# Patient Record
Sex: Female | Born: 1950 | Race: White | Hispanic: No | Marital: Married
Health system: Southern US, Community
[De-identification: ages and names within clinical notes are randomized; demographics above are authoritative.]

---

## 1998-11-17 ENCOUNTER — Other Ambulatory Visit: Admission: RE | Admit: 1998-11-17 | Discharge: 1998-11-17 | Payer: Self-pay | Admitting: *Deleted

## 1999-07-07 ENCOUNTER — Encounter: Payer: Self-pay | Admitting: Obstetrics and Gynecology

## 1999-07-07 ENCOUNTER — Ambulatory Visit (HOSPITAL_COMMUNITY): Admission: RE | Admit: 1999-07-07 | Discharge: 1999-07-07 | Payer: Self-pay | Admitting: Obstetrics and Gynecology

## 2000-01-02 ENCOUNTER — Other Ambulatory Visit: Admission: RE | Admit: 2000-01-02 | Discharge: 2000-01-02 | Payer: Self-pay | Admitting: *Deleted

## 2000-02-07 ENCOUNTER — Encounter: Admission: RE | Admit: 2000-02-07 | Discharge: 2000-02-07 | Payer: Self-pay | Admitting: Obstetrics and Gynecology

## 2000-02-07 ENCOUNTER — Encounter: Payer: Self-pay | Admitting: Obstetrics and Gynecology

## 2001-02-12 ENCOUNTER — Ambulatory Visit (HOSPITAL_COMMUNITY): Admission: RE | Admit: 2001-02-12 | Discharge: 2001-02-12 | Payer: Self-pay | Admitting: Obstetrics and Gynecology

## 2001-02-12 ENCOUNTER — Encounter: Payer: Self-pay | Admitting: Obstetrics and Gynecology

## 2001-03-13 ENCOUNTER — Other Ambulatory Visit: Admission: RE | Admit: 2001-03-13 | Discharge: 2001-03-13 | Payer: Self-pay | Admitting: Obstetrics and Gynecology

## 2002-02-18 ENCOUNTER — Ambulatory Visit (HOSPITAL_COMMUNITY): Admission: RE | Admit: 2002-02-18 | Discharge: 2002-02-18 | Payer: Self-pay | Admitting: Obstetrics and Gynecology

## 2002-02-18 ENCOUNTER — Encounter: Payer: Self-pay | Admitting: Obstetrics and Gynecology

## 2002-07-07 ENCOUNTER — Other Ambulatory Visit: Admission: RE | Admit: 2002-07-07 | Discharge: 2002-07-07 | Payer: Self-pay | Admitting: Obstetrics and Gynecology

## 2003-03-05 ENCOUNTER — Ambulatory Visit (HOSPITAL_COMMUNITY): Admission: RE | Admit: 2003-03-05 | Discharge: 2003-03-05 | Payer: Self-pay | Admitting: Obstetrics and Gynecology

## 2003-03-05 ENCOUNTER — Encounter: Payer: Self-pay | Admitting: Obstetrics and Gynecology

## 2004-03-11 ENCOUNTER — Ambulatory Visit (HOSPITAL_COMMUNITY): Admission: RE | Admit: 2004-03-11 | Discharge: 2004-03-11 | Payer: Self-pay | Admitting: Obstetrics and Gynecology

## 2005-03-27 ENCOUNTER — Ambulatory Visit (HOSPITAL_COMMUNITY): Admission: RE | Admit: 2005-03-27 | Discharge: 2005-03-27 | Payer: Self-pay | Admitting: *Deleted

## 2006-05-09 ENCOUNTER — Ambulatory Visit (HOSPITAL_COMMUNITY): Admission: RE | Admit: 2006-05-09 | Discharge: 2006-05-09 | Payer: Self-pay | Admitting: Obstetrics and Gynecology

## 2007-05-27 ENCOUNTER — Ambulatory Visit (HOSPITAL_COMMUNITY): Admission: RE | Admit: 2007-05-27 | Discharge: 2007-05-27 | Payer: Self-pay | Admitting: Obstetrics and Gynecology

## 2008-06-09 ENCOUNTER — Ambulatory Visit (HOSPITAL_COMMUNITY): Admission: RE | Admit: 2008-06-09 | Discharge: 2008-06-09 | Payer: Self-pay | Admitting: Obstetrics and Gynecology

## 2009-06-10 ENCOUNTER — Ambulatory Visit (HOSPITAL_COMMUNITY): Admission: RE | Admit: 2009-06-10 | Discharge: 2009-06-10 | Payer: Self-pay | Admitting: Obstetrics and Gynecology

## 2010-06-13 ENCOUNTER — Ambulatory Visit (HOSPITAL_COMMUNITY)
Admission: RE | Admit: 2010-06-13 | Discharge: 2010-06-13 | Payer: Self-pay | Source: Home / Self Care | Attending: Obstetrics and Gynecology | Admitting: Obstetrics and Gynecology

## 2011-05-12 ENCOUNTER — Other Ambulatory Visit (HOSPITAL_COMMUNITY): Payer: Self-pay | Admitting: Obstetrics

## 2011-05-12 ENCOUNTER — Other Ambulatory Visit (HOSPITAL_COMMUNITY): Payer: Self-pay | Admitting: Obstetrics and Gynecology

## 2011-05-12 DIAGNOSIS — Z1231 Encounter for screening mammogram for malignant neoplasm of breast: Secondary | ICD-10-CM

## 2011-06-15 ENCOUNTER — Ambulatory Visit (HOSPITAL_COMMUNITY)
Admission: RE | Admit: 2011-06-15 | Discharge: 2011-06-15 | Disposition: A | Discharge status: Home / Self Care | Payer: BC Managed Care – PPO | Source: Ambulatory Visit | Attending: Obstetrics and Gynecology | Admitting: Obstetrics and Gynecology

## 2011-06-15 DIAGNOSIS — Z1231 Encounter for screening mammogram for malignant neoplasm of breast: Secondary | ICD-10-CM

## 2012-06-14 ENCOUNTER — Other Ambulatory Visit (HOSPITAL_COMMUNITY): Payer: Self-pay | Admitting: Obstetrics and Gynecology

## 2012-06-14 DIAGNOSIS — Z1231 Encounter for screening mammogram for malignant neoplasm of breast: Secondary | ICD-10-CM

## 2012-07-01 ENCOUNTER — Ambulatory Visit (HOSPITAL_COMMUNITY)
Admission: RE | Admit: 2012-07-01 | Discharge: 2012-07-01 | Disposition: A | Discharge status: Home / Self Care | Payer: BC Managed Care – PPO | Source: Ambulatory Visit | Attending: Obstetrics and Gynecology | Admitting: Obstetrics and Gynecology

## 2012-07-01 DIAGNOSIS — Z1231 Encounter for screening mammogram for malignant neoplasm of breast: Secondary | ICD-10-CM

## 2013-05-28 ENCOUNTER — Other Ambulatory Visit (HOSPITAL_COMMUNITY): Payer: Self-pay | Admitting: Obstetrics and Gynecology

## 2013-05-28 DIAGNOSIS — Z1231 Encounter for screening mammogram for malignant neoplasm of breast: Secondary | ICD-10-CM

## 2013-07-02 ENCOUNTER — Ambulatory Visit (HOSPITAL_COMMUNITY)
Admission: RE | Admit: 2013-07-02 | Discharge: 2013-07-02 | Disposition: A | Discharge status: Home / Self Care | Payer: BC Managed Care – PPO | Source: Ambulatory Visit | Attending: Obstetrics and Gynecology | Admitting: Obstetrics and Gynecology

## 2013-07-02 DIAGNOSIS — Z1231 Encounter for screening mammogram for malignant neoplasm of breast: Secondary | ICD-10-CM | POA: Insufficient documentation

## 2013-07-02 IMAGING — MG MM 3D TOMO
8 series · 9 of 24 positions shown · non-contrast
Comparison: Previous exam(s).

CLINICAL DATA: Screening.

EXAM:
DIGITAL SCREENING BILATERAL MAMMOGRAM WITH 3D TOMO WITH CAD

[R MLO]
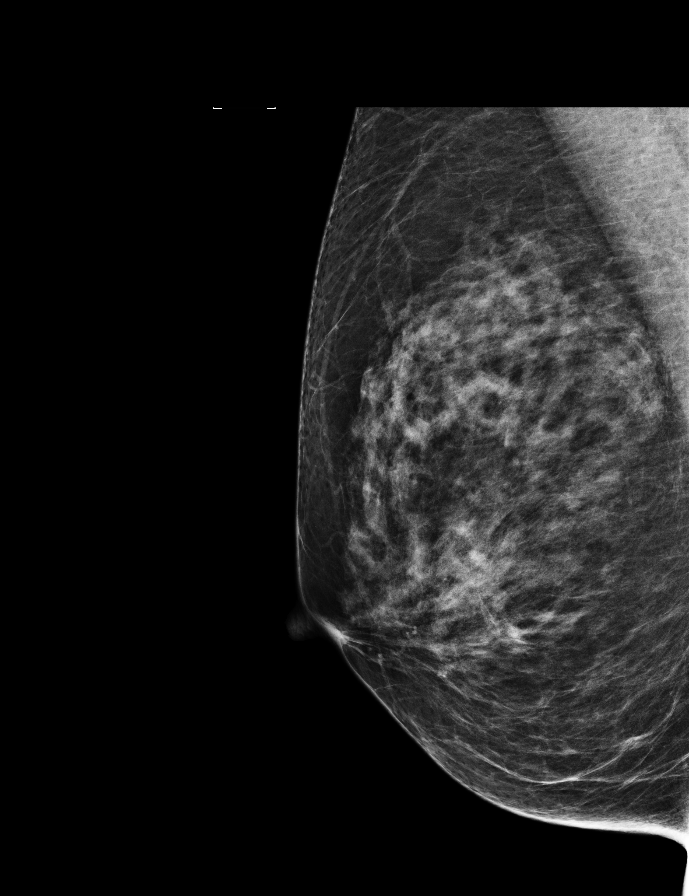

[L CC]
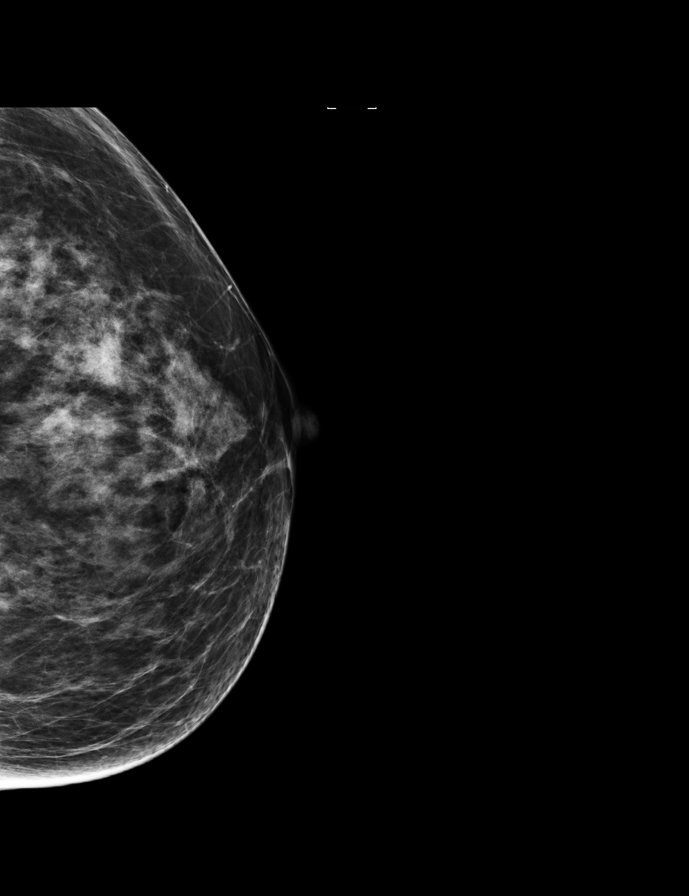

[L MLO]
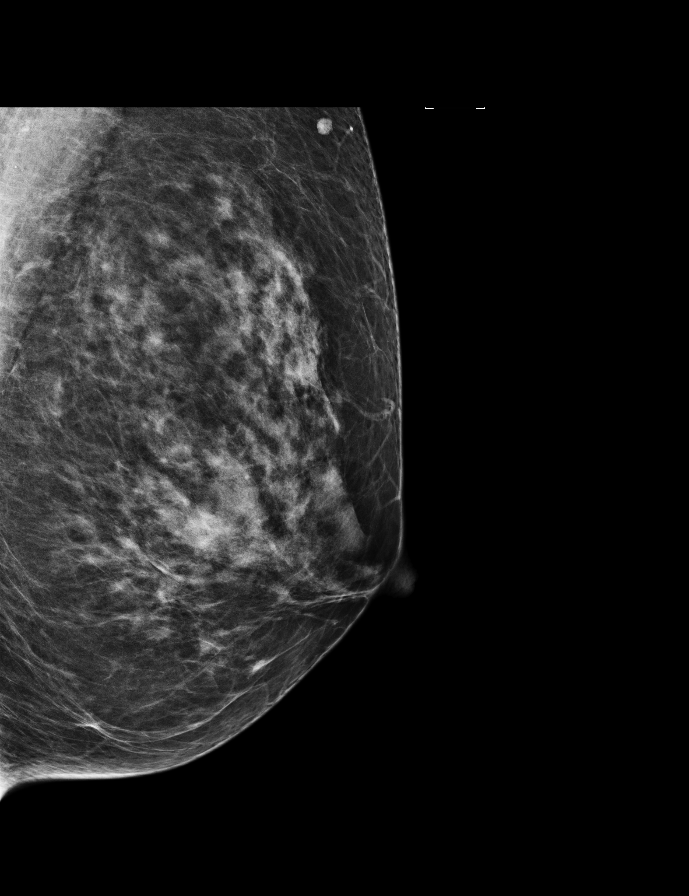

[R CC]
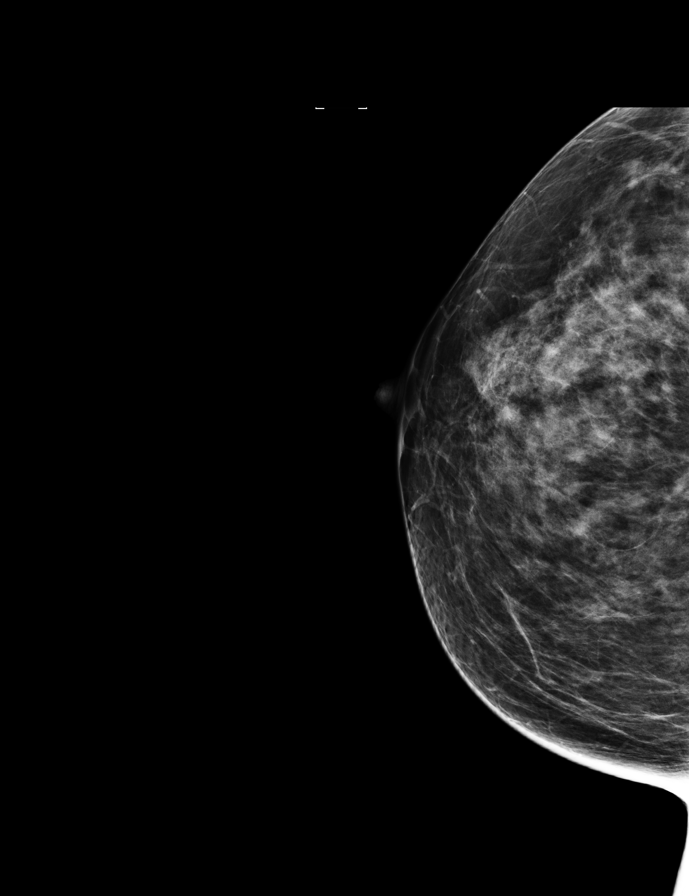

[L CC tomo · 2 of 80 frames shown]
[frame 26/80]
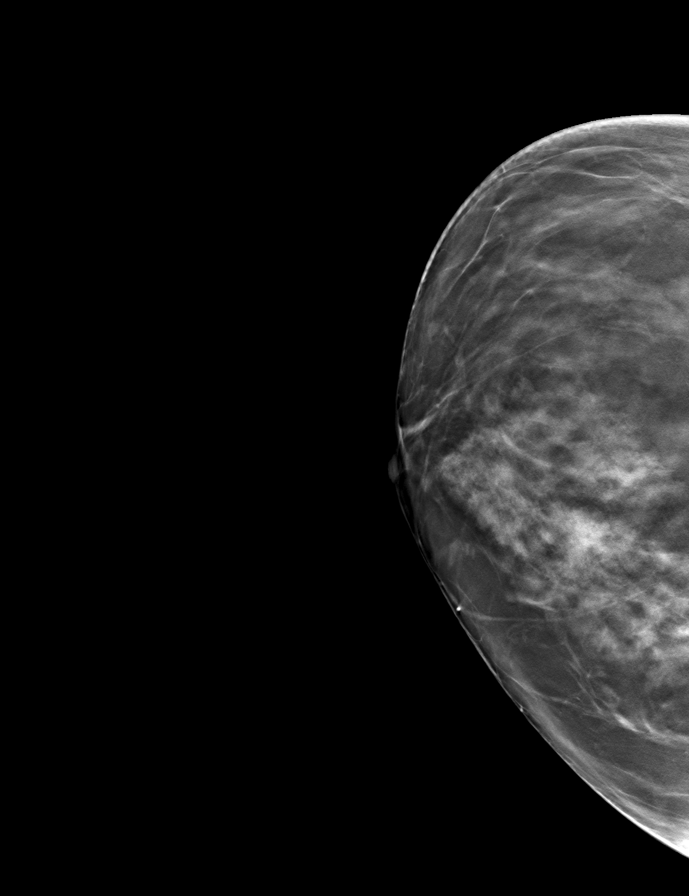
[frame 41/80]
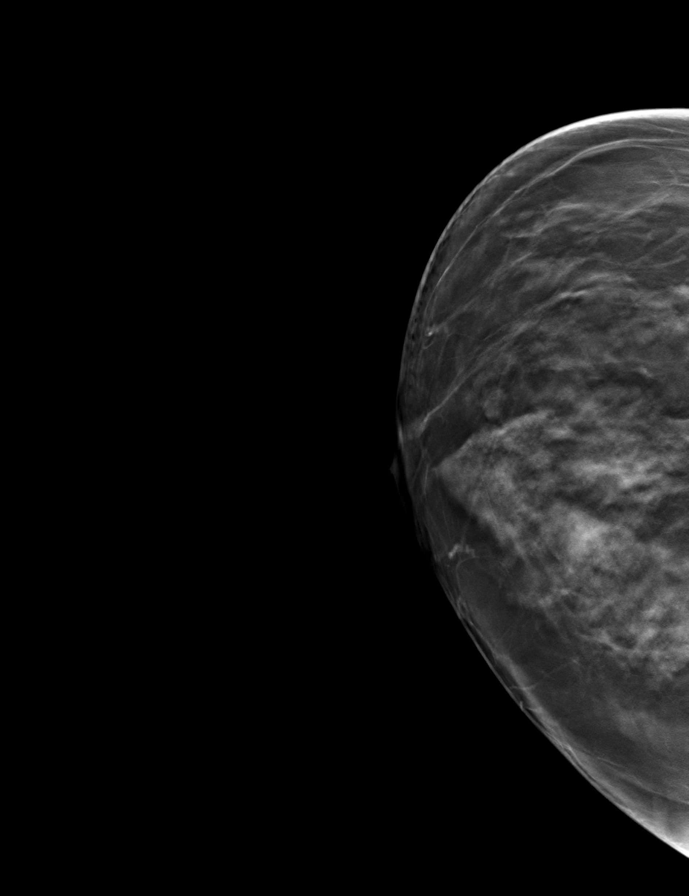

[L MLO tomo · tomo slice 43/86.0]
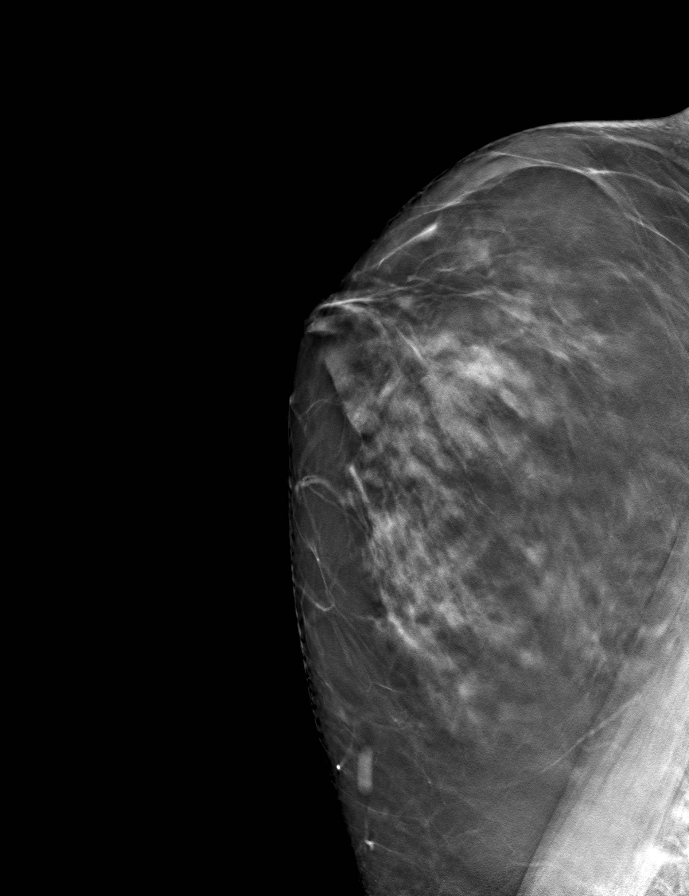

[R MLO tomo · tomo slice 43/86.0]
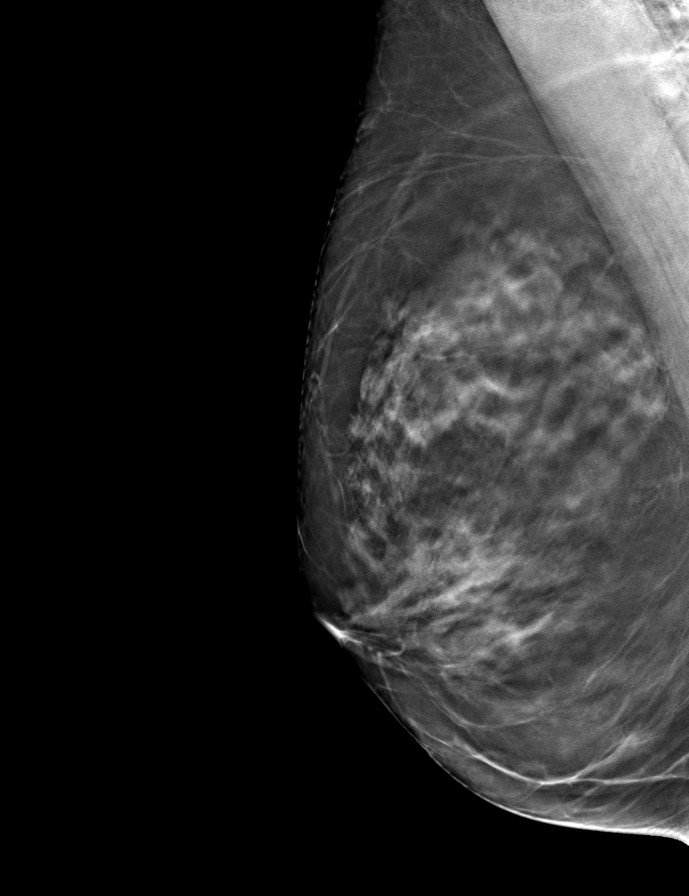

[R CC tomo · tomo slice 41/81.0]
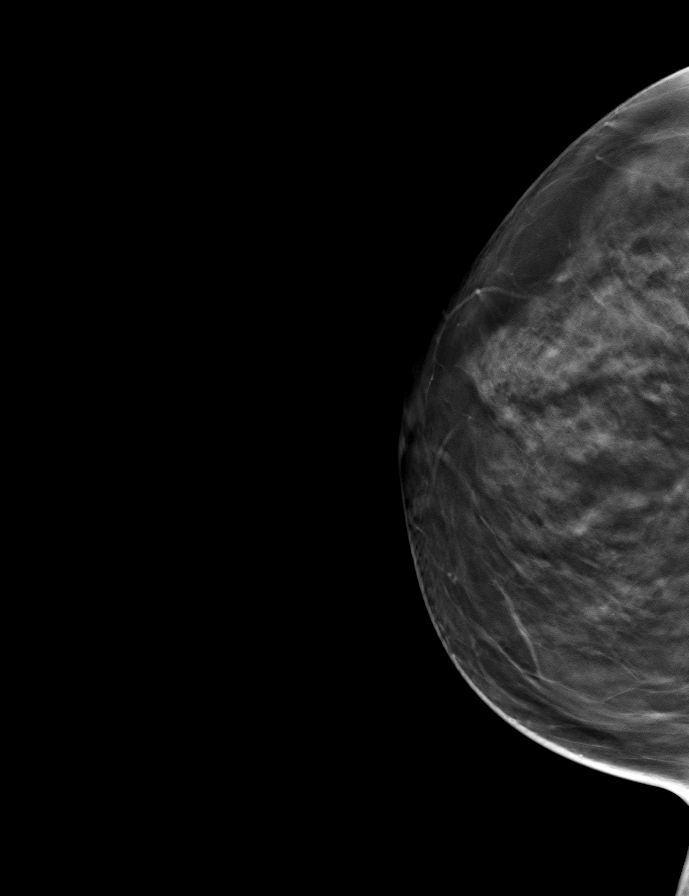

[9 of 24 positions shown; findings below may reference images not displayed]

ACR Breast Density Category c: The breast tissue is heterogeneously
dense, which may obscure small masses.
FINDINGS: There are no findings suspicious for malignancy. Images were
processed with CAD.
IMPRESSION: No mammographic evidence of malignancy. A result letter of this
screening mammogram will be mailed directly to the patient.

RECOMMENDATION:
Screening mammogram in one year. (Code:[OT])

BI-RADS CATEGORY  1: Negative

## 2014-08-17 ENCOUNTER — Other Ambulatory Visit (HOSPITAL_COMMUNITY): Payer: Self-pay | Admitting: Obstetrics and Gynecology

## 2014-08-17 DIAGNOSIS — Z1231 Encounter for screening mammogram for malignant neoplasm of breast: Secondary | ICD-10-CM

## 2014-08-19 ENCOUNTER — Ambulatory Visit (HOSPITAL_COMMUNITY)
Admission: RE | Admit: 2014-08-19 | Discharge: 2014-08-19 | Disposition: A | Discharge status: Home / Self Care | Payer: BLUE CROSS/BLUE SHIELD | Source: Ambulatory Visit | Attending: Obstetrics and Gynecology | Admitting: Obstetrics and Gynecology

## 2014-08-19 DIAGNOSIS — Z1231 Encounter for screening mammogram for malignant neoplasm of breast: Secondary | ICD-10-CM | POA: Diagnosis not present

## 2023-07-18 ENCOUNTER — Encounter (HOSPITAL_BASED_OUTPATIENT_CLINIC_OR_DEPARTMENT_OTHER): Payer: Self-pay

## 2023-07-18 ENCOUNTER — Ambulatory Visit (HOSPITAL_BASED_OUTPATIENT_CLINIC_OR_DEPARTMENT_OTHER)
Admission: RE | Admit: 2023-07-18 | Discharge: 2023-07-18 | Disposition: A | Discharge status: Home / Self Care | Payer: Medicare Other | Source: Ambulatory Visit | Attending: Family Medicine | Admitting: Family Medicine

## 2023-07-18 ENCOUNTER — Other Ambulatory Visit (HOSPITAL_BASED_OUTPATIENT_CLINIC_OR_DEPARTMENT_OTHER): Payer: Self-pay

## 2023-07-18 VITALS — BP 167/88 | HR 99 | Temp 98.4°F | Resp 18

## 2023-07-18 DIAGNOSIS — J101 Influenza due to other identified influenza virus with other respiratory manifestations: Secondary | ICD-10-CM

## 2023-07-18 DIAGNOSIS — R051 Acute cough: Secondary | ICD-10-CM | POA: Diagnosis not present

## 2023-07-18 LAB — POCT INFLUENZA A/B
Influenza A, POC: POSITIVE — AB
Influenza B, POC: NEGATIVE

## 2023-07-18 MED ORDER — OSELTAMIVIR PHOSPHATE 75 MG PO CAPS
75.0000 mg | ORAL_CAPSULE | Freq: Two times a day (BID) | ORAL | 0 refills | Status: DC
  Filled 2023-07-18: qty 10, 5d supply, fill #0

## 2023-07-18 MED ORDER — PROMETHAZINE-DM 6.25-15 MG/5ML PO SYRP
5.0000 mL | ORAL_SOLUTION | Freq: Four times a day (QID) | ORAL | 0 refills | Status: DC | PRN
  Filled 2023-07-18: qty 118, 6d supply, fill #0

## 2023-07-18 NOTE — ED Triage Notes (Signed)
Pt c/o sore throat, headache tha started on Sunday, now she has congestion, cough.

## 2023-07-18 NOTE — Discharge Instructions (Signed)
Flu is type a flu positive.  Her symptoms are right at 48 to 52 hours.  Will treat with Tamiflu, 75 mg, twice daily for 5 days.  Get plenty of fluids and rest.  Promethazine DM, 5 mL or 1 teaspoon, every 6 hours, as needed for cough.  Promethazine DM may make you drowsy.  Do not use and drive.  Follow-up if symptoms do not improve, worsen or new symptoms occur.

## 2023-07-18 NOTE — ED Provider Notes (Signed)
[facility]    CSN: [id number] Arrival date & time: 07/18/23  1321      History   Chief Complaint Chief Complaint  Patient presents with   Cough   Sore Throat   Headache    HPI Savannah Boyd is a 73 y.o. female.   Here with complaint of mild head congestion and cough since Sunday, 07/15/2023.  Her symptoms worsened Monday night, 07/16/2023.  She denies fever, diarrhea, vomiting, constipation.  She has had some sore throat, headache, mild nausea.   Cough Associated symptoms: headaches, rhinorrhea and sore throat   Associated symptoms: no chest pain, no chills, no ear pain, no fever, no rash and no shortness of breath   Sore Throat Associated symptoms include headaches. Pertinent negatives include no chest pain, no abdominal pain and no shortness of breath.  Headache Associated symptoms: congestion, cough, drainage, nausea and sore throat   Associated symptoms: no abdominal pain, no back pain, no diarrhea, no ear pain, no eye pain, no fever, no seizures, no sinus pressure and no vomiting     History reviewed. No pertinent past medical history.  There are no active problems to display for this patient.   History reviewed. No pertinent surgical history.  OB History   No obstetric history on file.      Home Medications    Prior to Admission medications   Medication Sig Start Date End Date Taking? Authorizing Provider  oseltamivir (TAMIFLU) 75 MG capsule Take 1 capsule (75 mg total) by mouth every 12 (twelve) hours. 07/18/23  Yes Prescilla Sours, FNP  promethazine-dextromethorphan (PROMETHAZINE-DM) 6.25-15 MG/5ML syrup Take 5 mLs by mouth 4 (four) times daily as needed for cough. 07/18/23  Yes Prescilla Sours, FNP    Family History History reviewed. No pertinent family history.  Social History Social History   Tobacco Use   Smoking status: Never   Smokeless tobacco: Never  Substance Use Topics   Alcohol use: Never   Drug use: Never     Allergies    Patient has no known allergies.   Review of Systems Review of Systems  Constitutional:  Negative for chills and fever.  HENT:  Positive for congestion, postnasal drip, rhinorrhea and sore throat. Negative for ear pain, sinus pressure and sinus pain.   Eyes:  Negative for pain and visual disturbance.  Respiratory:  Positive for cough. Negative for shortness of breath.   Cardiovascular:  Negative for chest pain and palpitations.  Gastrointestinal:  Positive for nausea. Negative for abdominal pain, constipation, diarrhea and vomiting.  Genitourinary:  Negative for dysuria and hematuria.  Musculoskeletal:  Negative for arthralgias and back pain.  Skin:  Negative for color change and rash.  Neurological:  Positive for headaches. Negative for seizures and syncope.  All other systems reviewed and are negative.    Physical Exam Triage Vital Signs ED Triage Vitals  Encounter Vitals Group     BP 07/18/23 1351 (!) 167/88     Systolic BP Percentile --      Diastolic BP Percentile --      Pulse Rate 07/18/23 1351 99     Resp 07/18/23 1351 18     Temp 07/18/23 1351 98.4 F (36.9 C)     Temp Source 07/18/23 1351 Oral     SpO2 07/18/23 1351 95 %     Weight --      Height --      Head Circumference --      Peak Flow --  Pain Score 07/18/23 1350 0     Pain Loc --      Pain Education --      Exclude from Growth Chart --    No data found.  Updated Vital Signs BP (!) 167/88 (BP Location: Left Arm)   Pulse 99   Temp 98.4 F (36.9 C) (Oral)   Resp 18   SpO2 95%   Visual Acuity Right Eye Distance:   Left Eye Distance:   Bilateral Distance:    Right Eye Near:   Left Eye Near:    Bilateral Near:     Physical Exam Vitals and nursing note reviewed.  Constitutional:      General: She is not in acute distress.    Appearance: She is well-developed. She is not ill-appearing or toxic-appearing.  HENT:     Head: Normocephalic and atraumatic.     Right Ear: Hearing, tympanic  membrane, ear canal and external ear normal.     Left Ear: Hearing, tympanic membrane, ear canal and external ear normal.     Nose: Mucosal edema, congestion and rhinorrhea present. Rhinorrhea is clear.     Right Sinus: Frontal sinus tenderness present. No maxillary sinus tenderness.     Left Sinus: Frontal sinus tenderness present. No maxillary sinus tenderness.     Mouth/Throat:     Lips: Pink.     Mouth: Mucous membranes are moist.     Pharynx: Uvula midline. No oropharyngeal exudate or posterior oropharyngeal erythema.     Tonsils: No tonsillar exudate.  Eyes:     Conjunctiva/sclera: Conjunctivae normal.     Pupils: Pupils are equal, round, and reactive to light.  Cardiovascular:     Rate and Rhythm: Normal rate and regular rhythm.     Heart sounds: S1 normal and S2 normal. No murmur heard. Pulmonary:     Effort: Pulmonary effort is normal. No respiratory distress.     Breath sounds: Normal breath sounds. No decreased breath sounds, wheezing, rhonchi or rales.  Abdominal:     Palpations: Abdomen is soft.     Tenderness: There is no abdominal tenderness.  Musculoskeletal:        General: No swelling.     Cervical back: Neck supple.  Lymphadenopathy:     Head:     Right side of head: No submental, submandibular, tonsillar, preauricular or posterior auricular adenopathy.     Left side of head: No submental, submandibular, tonsillar, preauricular or posterior auricular adenopathy.     Cervical: No cervical adenopathy.     Right cervical: No superficial cervical adenopathy.    Left cervical: No superficial cervical adenopathy.  Skin:    General: Skin is warm and dry.     Capillary Refill: Capillary refill takes less than 2 seconds.     Findings: No rash.  Neurological:     Mental Status: She is alert and oriented to person, place, and time.  Psychiatric:        Mood and Affect: Mood normal.      UC Treatments / Results  Labs (all labs ordered are listed, but only abnormal  results are displayed) Labs Reviewed  POCT INFLUENZA A/B - Abnormal; Notable for the following components:      Result Value   Influenza A, POC Positive (*)    All other components within normal limits    EKG   Radiology No results found.  Procedures Procedures (including critical care time)  Medications Ordered in UC Medications - No data to display  Initial Impression / Assessment and Plan / UC Course  I have reviewed the triage vital signs and the nursing notes.  Pertinent labs & imaging results that were available during my care of the patient were reviewed by me and considered in my medical decision making (see chart for details).  Influenza type a test is positive.  Initial cough started about 50 hours ago, maybe 56 hours ago.  But the significant worsening of symptoms was about 36 hours ago.  Will treat with Tamiflu, 75 mg, twice daily for 5 days.  No wheezing does not need an inhaler.  Promethazine DM, 5 mL, every 6 hours, as needed for cough.  Promethazine DM could make you drowsy.  Do not use and drive.  Get plenty of fluids and rest.  Follow-up if symptoms do not improve, worsen or new symptoms occur. Final Clinical Impressions(s) / UC Diagnoses   Final diagnoses:  Acute cough  Type A influenza     Discharge Instructions      Flu is type a flu positive.  Her symptoms are right at 48 to 52 hours.  Will treat with Tamiflu, 75 mg, twice daily for 5 days.  Get plenty of fluids and rest.  Promethazine DM, 5 mL or 1 teaspoon, every 6 hours, as needed for cough.  Promethazine DM may make you drowsy.  Do not use and drive.  Follow-up if symptoms do not improve, worsen or new symptoms occur.     ED Prescriptions     Medication Sig Dispense Auth. Provider   oseltamivir (TAMIFLU) 75 MG capsule Take 1 capsule (75 mg total) by mouth every 12 (twelve) hours. 10 capsule Prescilla Sours, FNP   promethazine-dextromethorphan (PROMETHAZINE-DM) 6.25-15 MG/5ML syrup Take 5 mLs  by mouth 4 (four) times daily as needed for cough. 118 mL Prescilla Sours, FNP      PDMP not reviewed this encounter.   Prescilla Sours, FNP 07/18/23 1447

## 2023-12-01 ENCOUNTER — Ambulatory Visit (HOSPITAL_BASED_OUTPATIENT_CLINIC_OR_DEPARTMENT_OTHER): Admission: EM | Admit: 2023-12-01

## 2023-12-01 ENCOUNTER — Encounter (HOSPITAL_BASED_OUTPATIENT_CLINIC_OR_DEPARTMENT_OTHER): Payer: Self-pay

## 2023-12-01 ENCOUNTER — Ambulatory Visit (HOSPITAL_BASED_OUTPATIENT_CLINIC_OR_DEPARTMENT_OTHER)
Admission: RE | Admit: 2023-12-01 | Discharge: 2023-12-01 | Disposition: A | Discharge status: Home / Self Care | Source: Ambulatory Visit | Attending: Family Medicine | Admitting: Family Medicine

## 2023-12-01 VITALS — BP 148/78 | HR 72 | Temp 98.7°F | Resp 20

## 2023-12-01 DIAGNOSIS — J3489 Other specified disorders of nose and nasal sinuses: Secondary | ICD-10-CM | POA: Diagnosis not present

## 2023-12-01 DIAGNOSIS — J0140 Acute pansinusitis, unspecified: Secondary | ICD-10-CM

## 2023-12-01 MED ORDER — DOXYCYCLINE HYCLATE 100 MG PO CAPS: 100.0000 mg | ORAL_CAPSULE | Freq: Two times a day (BID) | ORAL | 0 refills | Status: AC

## 2023-12-01 MED ORDER — SINUS RINSE BOTTLE KIT NA PACK: 1.0000 | PACK | Freq: Two times a day (BID) | NASAL | 0 refills | Status: AC | PRN

## 2023-12-01 MED ORDER — FLUTICASONE PROPIONATE 50 MCG/ACT NA SUSP: 1.0000 | Freq: Two times a day (BID) | NASAL | 0 refills | Status: AC | PRN

## 2023-12-01 NOTE — Discharge Instructions (Addendum)
 Sinusitis and sinus pressure: Doxycycline 100 mg twice daily for 10 days.  Take with food.  Encouraged using a probiotic or eating yogurt while you are on this antibiotic.  Fluticasone nasal spray, 1 spray into each nostril once or twice daily as needed for nasal congestion.  Encouraged sinus rinses once or twice daily.  Follow-up if symptoms do not improve, worsen or new symptoms occur.

## 2023-12-01 NOTE — ED Provider Notes (Addendum)
 [facility]    CSN: [id number] Arrival date & time: 12/01/23  1258      History   Chief Complaint Chief Complaint  Patient presents with   Sinus congestion   sinus pressure    HPI Savannah Boyd is a 73 y.o. female.   Patient reports runny nose, cough, congestion, sinus pressure.  Symptoms started on 11/21/2023.  She is so congested she is followed asleep with her mouth open.  And that is causing a sore throat.     History reviewed. No pertinent past medical history.  There are no active problems to display for this patient.   History reviewed. No pertinent surgical history.  OB History   No obstetric history on file.      Home Medications    Prior to Admission medications   Medication Sig Start Date End Date Taking? Authorizing Provider  doxycycline (VIBRAMYCIN) 100 MG capsule Take 1 capsule (100 mg total) by mouth 2 (two) times daily for 10 days. 12/01/23 12/11/23 Yes Guss Legacy, FNP  fluticasone (FLONASE) 50 MCG/ACT nasal spray Place 1 spray into both nostrils 2 (two) times daily as needed for rhinitis. 12/01/23 12/31/23 Yes Guss Legacy, FNP  Hypertonic Nasal Wash (SINUS RINSE BOTTLE KIT) PACK Place 1 each into the nose 2 (two) times daily as needed (nasal congestion/pressure). 12/01/23  Yes Guss Legacy, FNP    Family History History reviewed. No pertinent family history.  Social History Social History   Tobacco Use   Smoking status: Never   Smokeless tobacco: Never  Substance Use Topics   Alcohol use: Never   Drug use: Never     Allergies   Patient has no known allergies.   Review of Systems Review of Systems  Constitutional:  Negative for chills and fever.  HENT:  Positive for congestion, postnasal drip, rhinorrhea, sinus pressure, sinus pain and sore throat. Negative for ear pain.   Eyes:  Negative for pain and visual disturbance.  Respiratory:  Negative for cough and shortness of breath.   Cardiovascular:  Negative for chest pain  and palpitations.  Gastrointestinal:  Negative for abdominal pain, constipation, diarrhea, nausea and vomiting.  Genitourinary:  Negative for dysuria and hematuria.  Musculoskeletal:  Negative for arthralgias and back pain.  Skin:  Negative for color change and rash.  Neurological:  Negative for seizures and syncope.  All other systems reviewed and are negative.    Physical Exam Triage Vital Signs ED Triage Vitals  Encounter Vitals Group     BP 12/01/23 1301 (!) 171/97     Systolic BP Percentile --      Diastolic BP Percentile --      Pulse Rate 12/01/23 1301 72     Resp 12/01/23 1301 20     Temp 12/01/23 1301 98.7 F (37.1 C)     Temp Source 12/01/23 1301 Oral     SpO2 12/01/23 1301 95 %     Weight --      Height --      Head Circumference --      Peak Flow --      Pain Score 12/01/23 1303 6     Pain Loc --      Pain Education --      Exclude from Growth Chart --    No data found.  Updated Vital Signs BP (!) 148/78   Pulse 72   Temp 98.7 F (37.1 C) (Oral)   Resp 20   SpO2 95%   Visual  Acuity Right Eye Distance:   Left Eye Distance:   Bilateral Distance:    Right Eye Near:   Left Eye Near:    Bilateral Near:     Physical Exam Vitals and nursing note reviewed.  Constitutional:      General: She is not in acute distress.    Appearance: She is well-developed. She is not ill-appearing or toxic-appearing.  HENT:     Head: Normocephalic and atraumatic.     Right Ear: Hearing, tympanic membrane, ear canal and external ear normal.     Left Ear: Hearing, tympanic membrane, ear canal and external ear normal.     Nose: Congestion and rhinorrhea present. Rhinorrhea is purulent.     Right Sinus: Maxillary sinus tenderness and frontal sinus tenderness present.     Left Sinus: Maxillary sinus tenderness and frontal sinus tenderness present.     Mouth/Throat:     Lips: Pink.     Mouth: Mucous membranes are moist.     Pharynx: Uvula midline. No oropharyngeal exudate  or posterior oropharyngeal erythema.     Tonsils: No tonsillar exudate.  Eyes:     Conjunctiva/sclera: Conjunctivae normal.     Pupils: Pupils are equal, round, and reactive to light.  Cardiovascular:     Rate and Rhythm: Normal rate and regular rhythm.     Heart sounds: S1 normal and S2 normal. No murmur heard. Pulmonary:     Effort: Pulmonary effort is normal. No respiratory distress.     Breath sounds: Normal breath sounds. No decreased breath sounds, wheezing, rhonchi or rales.  Abdominal:     General: Bowel sounds are normal.     Palpations: Abdomen is soft.     Tenderness: There is no abdominal tenderness.  Musculoskeletal:        General: No swelling.     Cervical back: Neck supple.  Lymphadenopathy:     Head:     Right side of head: No submental, submandibular, tonsillar, preauricular or posterior auricular adenopathy.     Left side of head: No submental, submandibular, tonsillar, preauricular or posterior auricular adenopathy.     Cervical: Cervical adenopathy present.     Right cervical: Superficial cervical adenopathy present.     Left cervical: Superficial cervical adenopathy present.  Skin:    General: Skin is warm and dry.     Capillary Refill: Capillary refill takes less than 2 seconds.     Findings: No rash.  Neurological:     Mental Status: She is alert and oriented to person, place, and time.  Psychiatric:        Mood and Affect: Mood normal.      UC Treatments / Results  Labs (all labs ordered are listed, but only abnormal results are displayed) Labs Reviewed - No data to display  EKG   Radiology No results found.  Procedures Procedures (including critical care time)  Medications Ordered in UC Medications - No data to display  Initial Impression / Assessment and Plan / UC Course  I have reviewed the triage vital signs and the nursing notes.  Pertinent labs & imaging results that were available during my care of the patient were reviewed by  me and considered in my medical decision making (see chart for details).  Plan of Care: Sinusitis and sinus pressure: Doxycycline 100 mg twice daily for 10 days.  Encouraged sinus rinses twice daily.  Get plenty of fluids and rest.  Fluticasone nasal spray, 1 spray into each nostril once or twice  daily for nasal congestion.  Use acetaminophen or ibuprofen OTC as needed for headache or facial pain.  Follow-up if symptoms do not improve, worsen or new symptoms occur.  I reviewed the plan of care with the patient and/or the patient's guardian.  The patient and/or guardian had time to ask questions and acknowledged that the questions were answered.  I provided instruction on symptoms or reasons to return here or to go to an ER, if symptoms/condition did not improve, worsened or if new symptoms occurred.   Final Clinical Impressions(s) / UC Diagnoses   Final diagnoses:  Acute non-recurrent pansinusitis  Sinus pressure     Discharge Instructions      Sinusitis and sinus pressure: Doxycycline 100 mg twice daily for 10 days.  Take with food.  Encouraged using a probiotic or eating yogurt while you are on this antibiotic.  Fluticasone nasal spray, 1 spray into each nostril once or twice daily as needed for nasal congestion.  Encouraged sinus rinses once or twice daily.  Follow-up if symptoms do not improve, worsen or new symptoms occur.   ED Prescriptions     Medication Sig Dispense Auth. Provider   doxycycline (VIBRAMYCIN) 100 MG capsule Take 1 capsule (100 mg total) by mouth 2 (two) times daily for 10 days. 20 capsule Guss Legacy, FNP   fluticasone (FLONASE) 50 MCG/ACT nasal spray Place 1 spray into both nostrils 2 (two) times daily as needed for rhinitis. 17 mL Guss Legacy, FNP   Hypertonic Nasal Wash (SINUS RINSE BOTTLE KIT) PACK Place 1 each into the nose 2 (two) times daily as needed (nasal congestion/pressure). 1 each Guss Legacy, FNP      PDMP not reviewed this encounter.    Guss Legacy, FNP 12/01/23 1330    Guss Legacy, FNP 12/01/23 1331

## 2023-12-01 NOTE — ED Triage Notes (Signed)
 Symptoms x 10 days. Sinus pressure, congestion, post nasal drip. States slept with mouth open because of the congestion and now has a sore throat.
